# Patient Record
Sex: Male | Born: 1980 | Race: White | Hispanic: Yes | Marital: Single | Smoking: Current every day smoker
Health system: Southern US, Community
[De-identification: ages and names within clinical notes are randomized; demographics above are authoritative.]

## PROBLEM LIST (undated history)

## (undated) DIAGNOSIS — I10 Essential (primary) hypertension: Secondary | ICD-10-CM

---

## 2004-11-29 ENCOUNTER — Emergency Department (HOSPITAL_COMMUNITY): Admission: EM | Admit: 2004-11-29 | Discharge: 2004-11-29 | Payer: Self-pay | Admitting: Emergency Medicine

## 2012-02-14 ENCOUNTER — Ambulatory Visit: Payer: Self-pay

## 2012-02-14 ENCOUNTER — Ambulatory Visit: Payer: Self-pay | Admitting: Family Medicine

## 2012-02-14 VITALS — BP 118/74 | HR 66 | Temp 98.4°F | Resp 16 | Ht 65.25 in | Wt 189.4 lb

## 2012-02-14 DIAGNOSIS — M79609 Pain in unspecified limb: Secondary | ICD-10-CM

## 2012-02-14 DIAGNOSIS — M79672 Pain in left foot: Secondary | ICD-10-CM

## 2012-02-14 MED ORDER — CEPHALEXIN 500 MG PO CAPS: 1000.0000 mg | ORAL_CAPSULE | Freq: Two times a day (BID) | ORAL | Status: AC

## 2012-02-14 NOTE — Progress Notes (Signed)
  Patient Name: Alvon Nygaard Date of Birth: [birth year] Medical Record Number: [id number] Gender: male Date of Encounter: 02/14/2012  History of Present Illness:  Reinaldo Helt is a 31 y.o. very pleasant male patient who presents with the following:  On Wednesday (today is Friday) he stepped on a rusty nail while helping a friend do some work.  The nail went into his left foot through a work boot.  [facility].  However, yesterday he noted that his toes were swelling and the wound is more sore-or at least not any better.   There is no problem list on file for this patient.  No past medical history on file. No past surgical history on file. History  Substance Use Topics  . Smoking status: Current Everyday Smoker    Types: Cigarettes  . Smokeless tobacco: Not on file   Comment: smokes 1 cigarette per day  . Alcohol Use: Not on file   No family history on file. No Known Allergies  Medication list has been reviewed and updated.  Review of Systems: As per HPI- otherwise negative. He is generally healthy  Physical Examination: Filed Vitals:   02/14/12 0909  BP: 118/74  Pulse: 66  Temp: 98.4 F (36.9 C)  TempSrc: Oral  Resp: 16  Height: 5' 5.25" (1.657 m)  Weight: 189 lb 6.4 oz (85.911 kg)    Body mass index is 31.28 kg/(m^2).   GEN: WDWN, NAD, Non-toxic, Alert & Oriented x 3 HEENT: Atraumatic, Normocephalic.  Ears and Nose: No external deformity. EXTR: No clubbing/cyanosis/edema NEURO: Normal gait.  PSYCH: Normally interactive. Conversant. Not depressed or anxious appearing.  Calm demeanor.  His left foot shows a wound just proximal to the web space between his 3rd and 4th toes.  He has normal DP pulse, normal cap refill, sensation and movement of his toes. The foot is minimally swollen, but no redness or heat.  [facility] reading (PRIMARY) by  Dr. Patsy Lager.  No metallic FB or fracture   Assessment and Plan: 1. Foot pain,  left  DG Foot Complete Left, cephALEXin (KEFLEX) 500 MG capsule   Wound to foot that has caused soreness.  Treat with keflex to help prevent cellulitis as he has noted some increased soreness.  Patient (or parent if minor) instructed to return to clinic or call if not better in 2 day(s). Sooner if worse.

## 2015-08-29 ENCOUNTER — Emergency Department (HOSPITAL_COMMUNITY): Payer: Self-pay

## 2015-08-29 ENCOUNTER — Encounter (HOSPITAL_COMMUNITY): Payer: Self-pay | Admitting: *Deleted

## 2015-08-29 ENCOUNTER — Emergency Department (HOSPITAL_COMMUNITY)
Admission: EM | Admit: 2015-08-29 | Discharge: 2015-08-29 | Disposition: A | Discharge status: Home / Self Care | Payer: Self-pay | Attending: Emergency Medicine | Admitting: Emergency Medicine

## 2015-08-29 DIAGNOSIS — S39012A Strain of muscle, fascia and tendon of lower back, initial encounter: Secondary | ICD-10-CM | POA: Insufficient documentation

## 2015-08-29 DIAGNOSIS — W16212A Fall in (into) filled bathtub causing other injury, initial encounter: Secondary | ICD-10-CM | POA: Insufficient documentation

## 2015-08-29 DIAGNOSIS — Y9389 Activity, other specified: Secondary | ICD-10-CM | POA: Insufficient documentation

## 2015-08-29 DIAGNOSIS — Z720 Tobacco use: Secondary | ICD-10-CM | POA: Insufficient documentation

## 2015-08-29 DIAGNOSIS — Y9289 Other specified places as the place of occurrence of the external cause: Secondary | ICD-10-CM | POA: Insufficient documentation

## 2015-08-29 DIAGNOSIS — Y998 Other external cause status: Secondary | ICD-10-CM | POA: Insufficient documentation

## 2015-08-29 MED ORDER — CYCLOBENZAPRINE HCL 10 MG PO TABS
10.0000 mg | ORAL_TABLET | Freq: Once | ORAL | Status: AC
  Administered 2015-08-29: 10 mg via ORAL
  Filled 2015-08-29: qty 1

## 2015-08-29 MED ORDER — OXYCODONE-ACETAMINOPHEN 5-325 MG PO TABS
1.0000 | ORAL_TABLET | Freq: Once | ORAL | Status: AC
  Administered 2015-08-29: 1 via ORAL
  Filled 2015-08-29: qty 1

## 2015-08-29 MED ORDER — METHOCARBAMOL 500 MG PO TABS: 500.0000 mg | ORAL_TABLET | Freq: Two times a day (BID) | ORAL | Status: AC

## 2015-08-29 MED ORDER — DICLOFENAC SODIUM 75 MG PO TBEC: 75.0000 mg | DELAYED_RELEASE_TABLET | Freq: Two times a day (BID) | ORAL | Status: AC

## 2015-08-29 NOTE — Discharge Instructions (Signed)
Follow up with the orthopedic doctor. Do not drive while taking the muscle relaxant as it will make you sleepy.

## 2015-08-29 NOTE — ED Notes (Signed)
Pt reports he fell in tub o n 08-14-15 and back pain started after  fall. Pt reports pain is located  In lower back. OTC have not relieved pain.

## 2015-08-29 NOTE — ED Notes (Signed)
Pt is in stable condition upon d/c and ambulates from ED with spouse.

## 2015-08-29 NOTE — ED Provider Notes (Signed)
CSN: [id number]     Arrival date & time 08/29/15  1348 History  By signing my name below, I, Gwenyth Ober, attest that this documentation has been prepared under the direction and in the presence of Kerrie Buffalo, NP.  Electronically Signed: Gwenyth Ober, ED Scribe. 08/29/2015. 2:50 PM   Chief Complaint  Patient presents with  . Back Pain  . Fall   The history is provided by the patient. No language interpreter was used.    HPI Comments: Lawrence Russell is a 34 y.o. male who presents to the Emergency Department complaining of constant, moderate lower back pain that started 3 weeks ago, improved and was exacerbated last night. He has tried Ibuprofen and massage with some relief. His pain becomes worse with sitting, walking and bending. Pt reports onset of pain started after he almost fell in the shower, but caught himself with his hands before hitting the floor. Pt works in [employer]. He denies urinary symptoms.   History reviewed. No pertinent past medical history. History reviewed. No pertinent past surgical history. History reviewed. No pertinent family history. Social History  Substance Use Topics  . Smoking status: Current Every Day Smoker    Types: Cigarettes  . Smokeless tobacco: Never Used  . Alcohol Use: No    Review of Systems  Genitourinary: Negative for dysuria and difficulty urinating.  Musculoskeletal: Positive for back pain.  Skin: Negative for wound.  All other systems reviewed and are negative.   Allergies  Shellfish allergy  Home Medications   Prior to Admission medications   Medication Sig Start Date End Date Taking? Authorizing Kaylon Laroche  diclofenac (VOLTAREN) 75 MG EC tablet Take 1 tablet (75 mg total) by mouth 2 (two) times daily. 08/29/15   Hope Orlene Och, NP  methocarbamol (ROBAXIN) 500 MG tablet Take 1 tablet (500 mg total) by mouth 2 (two) times daily. 08/29/15   Hope Orlene Och, NP   BP 139/77 mmHg  Pulse 66  Temp(Src) 98 F (36.7 C) (Oral)  Resp 18   SpO2 100% Physical Exam  Constitutional: He is oriented to person, place, and time. He appears well-developed and well-nourished. No distress.  HENT:  Head: Normocephalic and atraumatic.  Eyes: EOM are normal. Pupils are equal, round, and reactive to light.  Neck: Normal range of motion. Neck supple.  Cardiovascular: Normal rate and regular rhythm.   Pulses:      Dorsalis pedis pulses are 2+ on the right side, and 2+ on the left side.  Pulmonary/Chest: Effort normal. No respiratory distress. He has no wheezes. He has no rales.  Abdominal: Soft. Bowel sounds are normal. There is no tenderness.  Musculoskeletal: Normal range of motion. He exhibits no edema.       Lumbar back: He exhibits tenderness. He exhibits normal range of motion, no deformity, no spasm and normal pulse.  Muscle spasm in the lower lumbar area Tenderness over the lumbar spine  Neurological: He is alert and oriented to person, place, and time. He has normal strength. No cranial nerve deficit or sensory deficit. Coordination and gait normal.  Reflex Scores:      Bicep reflexes are 2+ on the right side and 2+ on the left side.      Brachioradialis reflexes are 2+ on the right side and 2+ on the left side.      Patellar reflexes are 2+ on the right side and 2+ on the left side.      Achilles reflexes are 2+ on the right side and  2+ on the left side. Grips equal bilaterally  Skin: Skin is warm and dry.  Psychiatric: He has a normal mood and affect. His behavior is normal.  Nursing note and vitals reviewed.   ED Course  Procedures  Flexeril 10 mg PO DIAGNOSTIC STUDIES: Oxygen Saturation is 100% on RA, normal by my interpretation.    COORDINATION OF CARE: 2:44 PM Discussed treatment plan with pt which includes an x-ray of his lumbar spine and a muscle relaxant. Advised rest for patient. Pt agreed to plan.   Imaging Review Dg Lumbar Spine Complete  08/29/2015   CLINICAL DATA:  Larey Seat in shower 3 weeks ago.  Low back  pain.  EXAM: LUMBAR SPINE - COMPLETE 4+ VIEW  COMPARISON:  None.  FINDINGS: There is no evidence of lumbar spine fracture. Alignment is normal. Intervertebral disc spaces are maintained.  IMPRESSION: Negative.   Electronically Signed   By: Elsie Stain M.D.   On: 08/29/2015 16:26   I have personally reviewed and evaluated these images as part of my medical decision-making.   MDM  34 y.o. male with low back pain that has been off and on x 3 weeks stable for d/c without neuro deficits. Will treat for muscle spasm and he will follow up with ortho if symptoms persist.   Final diagnoses:  Lumbosacral strain, initial encounter   I personally performed the services described in this documentation, which was scribed in my presence. The recorded information has been reviewed and is accurate.    [facility] Orlene Och, NP 08/29/15 2355  Melene Plan, DO 08/30/15 9562

## 2019-09-17 ENCOUNTER — Ambulatory Visit: Payer: Self-pay | Admitting: Orthopedic Surgery

## 2019-11-04 ENCOUNTER — Other Ambulatory Visit: Payer: Self-pay

## 2019-11-04 DIAGNOSIS — Z20822 Contact with and (suspected) exposure to covid-19: Secondary | ICD-10-CM

## 2019-11-08 LAB — NOVEL CORONAVIRUS, NAA: SARS-CoV-2, NAA: DETECTED — AB

## 2019-11-16 ENCOUNTER — Telehealth: Payer: Self-pay | Admitting: *Deleted

## 2019-11-16 NOTE — Telephone Encounter (Signed)
Assisted pt with scheduling an appointment for covid testing. He voiced understanding. Appointment scheduled per pt.

## 2019-11-19 ENCOUNTER — Ambulatory Visit: Payer: HRSA Program | Attending: Internal Medicine

## 2019-11-19 DIAGNOSIS — Z20822 Contact with and (suspected) exposure to covid-19: Secondary | ICD-10-CM

## 2019-11-19 DIAGNOSIS — Z20828 Contact with and (suspected) exposure to other viral communicable diseases: Secondary | ICD-10-CM | POA: Diagnosis present

## 2019-11-20 LAB — NOVEL CORONAVIRUS, NAA: SARS-CoV-2, NAA: NOT DETECTED

## 2019-12-28 ENCOUNTER — Encounter (HOSPITAL_COMMUNITY): Payer: Self-pay | Admitting: Emergency Medicine

## 2019-12-28 ENCOUNTER — Emergency Department (HOSPITAL_COMMUNITY)
Admission: EM | Admit: 2019-12-28 | Discharge: 2019-12-28 | Disposition: A | Discharge status: Home / Self Care | Payer: Self-pay | Attending: Emergency Medicine | Admitting: Emergency Medicine

## 2019-12-28 ENCOUNTER — Emergency Department (HOSPITAL_COMMUNITY): Payer: Self-pay

## 2019-12-28 DIAGNOSIS — R05 Cough: Secondary | ICD-10-CM | POA: Insufficient documentation

## 2019-12-28 DIAGNOSIS — Z79899 Other long term (current) drug therapy: Secondary | ICD-10-CM | POA: Insufficient documentation

## 2019-12-28 DIAGNOSIS — I10 Essential (primary) hypertension: Secondary | ICD-10-CM | POA: Insufficient documentation

## 2019-12-28 DIAGNOSIS — J020 Streptococcal pharyngitis: Secondary | ICD-10-CM | POA: Insufficient documentation

## 2019-12-28 DIAGNOSIS — F17210 Nicotine dependence, cigarettes, uncomplicated: Secondary | ICD-10-CM | POA: Insufficient documentation

## 2019-12-28 HISTORY — DX: Essential (primary) hypertension: I10

## 2019-12-28 LAB — INFLUENZA PANEL BY PCR (TYPE A & B)
Influenza A By PCR: NEGATIVE
Influenza B By PCR: NEGATIVE

## 2019-12-28 LAB — GROUP A STREP BY PCR: Group A Strep by PCR: DETECTED — AB

## 2019-12-28 MED ORDER — PENICILLIN G BENZATHINE 1200000 UNIT/2ML IM SUSP
1.2000 10*6.[IU] | Freq: Once | INTRAMUSCULAR | Status: AC
  Administered 2019-12-28: 1.2 10*6.[IU] via INTRAMUSCULAR
  Filled 2019-12-28: qty 2

## 2019-12-28 MED ORDER — ACETAMINOPHEN 500 MG PO TABS
1000.0000 mg | ORAL_TABLET | Freq: Once | ORAL | Status: AC
  Administered 2019-12-28: 10:00:00 1000 mg via ORAL
  Filled 2019-12-28: qty 2

## 2019-12-28 NOTE — ED Triage Notes (Signed)
Pt here from home with c/o general body aches and fever with chills , pt had covid about 1 month ago

## 2019-12-28 NOTE — ED Provider Notes (Signed)
[facility] EMERGENCY DEPARTMENT Provider Note   CSN: [id number] Arrival date & time: 12/28/19  4332     History CC: Fever  Lawrence Russell is a 39 y.o. male with history of tobacco abuse and hypertension who presents to the emergency department with complaints of fever that began 3 days prior. Patient reports subjective fever, chills, nasal congestion, sore throat, body aches, & dry cough with 1 episode of post tussive emesis. No alleviating/aggravating factors. No intervention PTA. Tested positive for COVID 19 11/04/2019. No known sick contacts w/ similar sxs. Denies chest pain, abdominal pain, diarrhea, melena, hematochezia, unilateral leg pain/swelling, hemoptysis, recent surgery/trauma, recent long travel, hormone use, personal hx of cancer, or hx of DVT/PE.   HPI     Past Medical History:  Diagnosis Date  . Hypertension     There are no problems to display for this patient.   History reviewed. No pertinent surgical history.     No family history on file.  Social History   Tobacco Use  . Smoking status: Current Every Day Smoker    Types: Cigarettes  . Smokeless tobacco: Never Used  . Tobacco comment: smokes 1 cigarette per day  Substance Use Topics  . Alcohol use: No  . Drug use: No    Home Medications Prior to Admission medications   Medication Sig Start Date End Date Taking? Authorizing Provider  diclofenac (VOLTAREN) 75 MG EC tablet Take 1 tablet (75 mg total) by mouth 2 (two) times daily. 08/29/15   Ashley Murrain, NP  methocarbamol (ROBAXIN) 500 MG tablet Take 1 tablet (500 mg total) by mouth 2 (two) times daily. 08/29/15   Ashley Murrain, NP    Allergies    Shellfish allergy  Review of Systems   Review of Systems  Constitutional: Positive for chills and fever.  HENT: Positive for congestion and sore throat. Negative for ear pain.   Respiratory: Positive for cough. Negative for shortness of breath.   Cardiovascular: Negative for  chest pain and leg swelling.  Gastrointestinal: Positive for vomiting (x1 posttussive). Negative for abdominal pain, anal bleeding, blood in stool, constipation and diarrhea.  Genitourinary: Negative for dysuria.  Musculoskeletal: Positive for myalgias (generalized).  Neurological: Negative for syncope.    Physical Exam Updated Vital Signs BP (!) 152/108 (BP Location: Right Arm)   Pulse (!) 125   Temp (!) 101.4 F (38.6 C) (Oral)   Resp 18   SpO2 98%   Physical Exam Vitals and nursing note reviewed.  Constitutional:      General: He is not in acute distress.    Appearance: He is well-developed. He is not toxic-appearing.  HENT:     Head: Normocephalic and atraumatic.     Right Ear: Ear canal normal. Tympanic membrane is not perforated, erythematous, retracted or bulging.     Left Ear: Ear canal normal. Tympanic membrane is not perforated, erythematous, retracted or bulging.     Ears:     Comments: No mastoid erythema/swellng/tenderness.     Nose: Congestion present.     Right Sinus: No maxillary sinus tenderness or frontal sinus tenderness.     Left Sinus: No maxillary sinus tenderness or frontal sinus tenderness.     Mouth/Throat:     Pharynx: Oropharynx is clear. Uvula midline. Posterior oropharyngeal erythema present. No oropharyngeal exudate.     Tonsils: Tonsillar exudate present. 2+ on the right. 2+ on the left.     Comments: Posterior oropharynx is symmetric appearing. Patient tolerating own secretions  without difficulty. No trismus. No drooling. No hot potato voice. No swelling beneath the tongue, submandibular compartment is soft.  Eyes:     General:        Right eye: No discharge.        Left eye: No discharge.     Conjunctiva/sclera: Conjunctivae normal.  Cardiovascular:     Rate and Rhythm: Regular rhythm. Tachycardia present.  Pulmonary:     Effort: Pulmonary effort is normal. No respiratory distress.     Breath sounds: Normal breath sounds. No wheezing,  rhonchi or rales.     Comments: Ambulatory SPO2 maintained greater than 94%. Chest:     Chest wall: No tenderness.  Abdominal:     General: There is no distension.     Palpations: Abdomen is soft.     Tenderness: There is no abdominal tenderness. There is no guarding or rebound.  Musculoskeletal:     Cervical back: Neck supple. No rigidity.     Right lower leg: No edema.     Left lower leg: No edema.  Lymphadenopathy:     Cervical: Cervical adenopathy (mild) present.  Skin:    General: Skin is warm and dry.     Findings: No rash.  Neurological:     Mental Status: He is alert.  Psychiatric:        Behavior: Behavior normal.     ED Results / Procedures / Treatments   Labs (all labs ordered are listed, but only abnormal results are displayed) Labs Reviewed  GROUP A STREP BY PCR - Abnormal; Notable for the following components:      Result Value   Group A Strep by PCR DETECTED (*)    All other components within normal limits  INFLUENZA PANEL BY PCR (TYPE A & B)    EKG None  Radiology DG Chest Portable 1 View  Result Date: 12/28/2019 CLINICAL DATA:  Cough, body aches, fever EXAM: PORTABLE CHEST 1 VIEW COMPARISON:  None. FINDINGS: The heart size and mediastinal contours are within normal limits. Both lungs are clear. The visualized skeletal structures are unremarkable. IMPRESSION: No active disease. Electronically Signed   By: Duanne Guess D.O.   On: 12/28/2019 11:32    Procedures Procedures (including critical care time)  Medications Ordered in ED Medications  penicillin g benzathine (BICILLIN LA) 1200000 UNIT/2ML injection 1.2 Million Units (has no administration in time range)  acetaminophen (TYLENOL) tablet 1,000 mg (1,000 mg Oral Given 12/28/19 1026)    ED Course  I have reviewed the triage vital signs and the nursing notes.  Pertinent labs & imaging results that were available during my care of the patient were reviewed by me and considered in my medical  decision making (see chart for details).  Lawrence Russell was evaluated in Emergency Department on 12/28/2019 for the symptoms described in the history of present illness. He/she was evaluated in the context of the global COVID-19 pandemic, which necessitated consideration that the patient might be at risk for infection with the SARS-CoV-2 virus that causes COVID-19. Institutional protocols and algorithms that pertain to the evaluation of patients at risk for COVID-19 are in a state of rapid change based on information released by regulatory bodies including the CDC and federal and state organizations. These policies and algorithms were followed during the patient's care in the ED.    MDM Rules/Calculators/A&P                      Patient presents to the  emergency department with complaints of fever as well as several other associated symptoms as documented above.  Vitals notable for fever to 101.4 with likely resultant tachycardia, BP is also elevated, doubt HTN emergency.  No meningismus.  No signs of AOM, AOE, or mastoiditis.  Diagnosis of Covid within the past 90 days.  Chest x-ray without infiltrate, pneumothorax, or fluid overload.  Patient is not in respiratory distress.  Influenza testing negative.  Strep is positive.  No signs of RPA/PTA.  Tachycardia resolved following antipyretics.  Will treat with IM Bicillin in the emergency department.  Recommended further supportive care at home. I discussed results, treatment plan, need for follow-up, and return precautions with the patient. Provided opportunity for questions, patient confirmed understanding and is in agreement with plan.   Blood pressure 128/80, pulse 97, temperature (!) 101.4 F (38.6 C), temperature source Oral, resp. rate 18, SpO2 94 %.  Final Clinical Impression(s) / ED Diagnoses Final diagnoses:  Strep pharyngitis    Rx / DC Orders ED Discharge Orders    None       Desmond Lope 12/28/19 1234      Terrilee Files, MD 12/28/19 Ernestina Columbia

## 2019-12-28 NOTE — Discharge Instructions (Addendum)
You were seen in the emergency department and diagnosed with strep throat.  Your flu testing was negative.  Your chest x-ray was normal. You were given a shot of Penicillin- this is an antibiotic used to treat the infection  You should gradually feel better over the next few days. Take Tylenol and Ibuprofen for fever and pain. Follow up with your primary care provider in the next 1 week if you are not feeling better, if you do not have a primary care provider one is provided in your discharge instructions. Return to the emergency department for any new or worsening symptoms including but not limited to inability to open your mouth, inability to move your neck, worsening pain, change in your voice, inability to swallow your own saliva, drooling, or any other concerns.

## 2020-10-08 IMAGING — DX DG CHEST 1V PORT
1 series · 1 of 1 positions shown · non-contrast
Comparison: None.

CLINICAL DATA: Cough, body aches, fever

EXAM:
PORTABLE CHEST 1 VIEW

[chest ap]
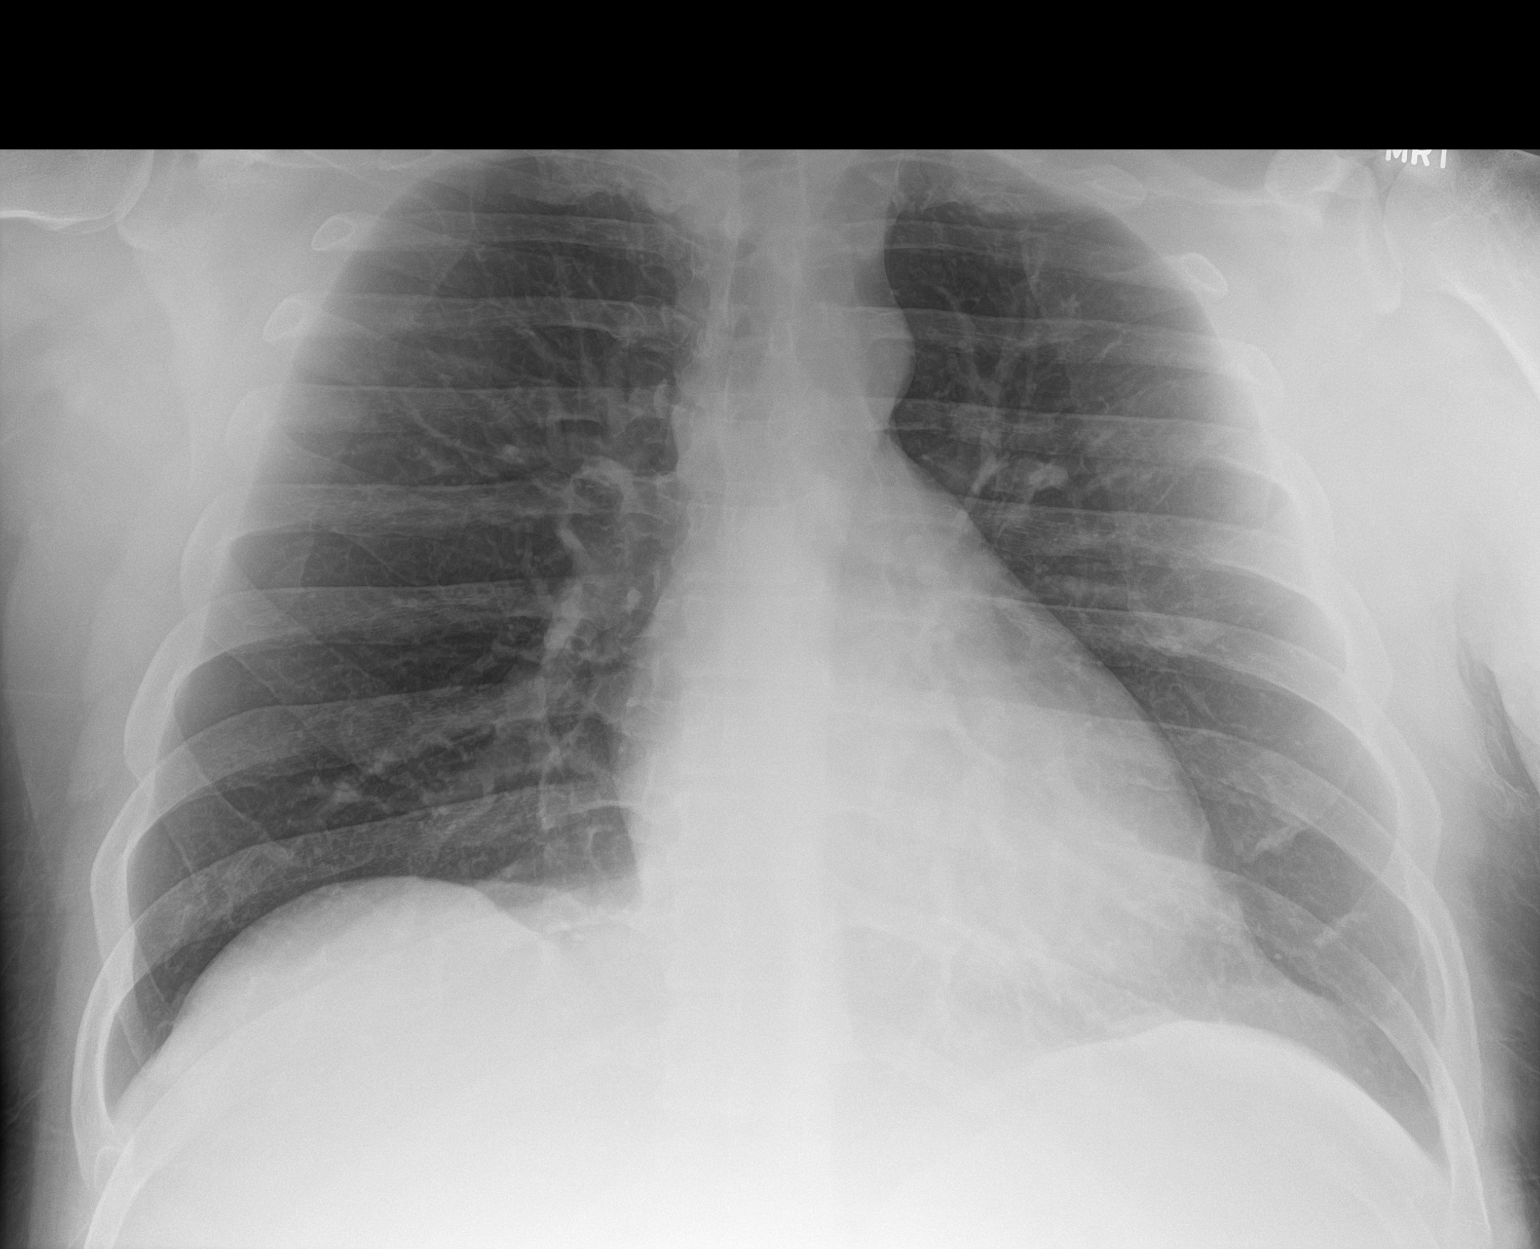

[1 of 1 positions shown; findings below may reference images not displayed]

FINDINGS: The heart size and mediastinal contours are within normal limits.
Both lungs are clear. The visualized skeletal structures are
unremarkable.
IMPRESSION: No active disease.

## 2022-09-30 ENCOUNTER — Emergency Department (HOSPITAL_COMMUNITY): Payer: Self-pay

## 2022-09-30 ENCOUNTER — Other Ambulatory Visit: Payer: Self-pay

## 2022-09-30 ENCOUNTER — Encounter (HOSPITAL_COMMUNITY): Payer: Self-pay

## 2022-09-30 ENCOUNTER — Emergency Department (HOSPITAL_COMMUNITY)
Admission: EM | Admit: 2022-09-30 | Discharge: 2022-09-30 | Disposition: A | Discharge status: Home / Self Care | Payer: Self-pay | Attending: Emergency Medicine | Admitting: Emergency Medicine

## 2022-09-30 DIAGNOSIS — Z794 Long term (current) use of insulin: Secondary | ICD-10-CM | POA: Insufficient documentation

## 2022-09-30 DIAGNOSIS — G5632 Lesion of radial nerve, left upper limb: Secondary | ICD-10-CM | POA: Insufficient documentation

## 2022-09-30 DIAGNOSIS — E1165 Type 2 diabetes mellitus with hyperglycemia: Secondary | ICD-10-CM | POA: Insufficient documentation

## 2022-09-30 DIAGNOSIS — I10 Essential (primary) hypertension: Secondary | ICD-10-CM | POA: Insufficient documentation

## 2022-09-30 DIAGNOSIS — R739 Hyperglycemia, unspecified: Secondary | ICD-10-CM

## 2022-09-30 DIAGNOSIS — Z7984 Long term (current) use of oral hypoglycemic drugs: Secondary | ICD-10-CM | POA: Insufficient documentation

## 2022-09-30 LAB — DIFFERENTIAL
Abs Immature Granulocytes: 0.01 10*3/uL (ref 0.00–0.07)
Basophils Absolute: 0 10*3/uL (ref 0.0–0.1)
Basophils Relative: 1 %
Eosinophils Absolute: 0.1 10*3/uL (ref 0.0–0.5)
Eosinophils Relative: 2 %
Immature Granulocytes: 0 %
Lymphocytes Relative: 48 %
Lymphs Abs: 2.6 10*3/uL (ref 0.7–4.0)
Monocytes Absolute: 0.4 10*3/uL (ref 0.1–1.0)
Monocytes Relative: 7 %
Neutro Abs: 2.2 10*3/uL (ref 1.7–7.7)
Neutrophils Relative %: 42 %

## 2022-09-30 LAB — CBG MONITORING, ED: Glucose-Capillary: 252 mg/dL — ABNORMAL HIGH (ref 70–99)

## 2022-09-30 LAB — CBC
HCT: 41.9 % (ref 39.0–52.0)
Hemoglobin: 14.2 g/dL (ref 13.0–17.0)
MCH: 29 pg (ref 26.0–34.0)
MCHC: 33.9 g/dL (ref 30.0–36.0)
MCV: 85.5 fL (ref 80.0–100.0)
Platelets: 243 10*3/uL (ref 150–400)
RBC: 4.9 MIL/uL (ref 4.22–5.81)
RDW: 12 % (ref 11.5–15.5)
WBC: 5.3 10*3/uL (ref 4.0–10.5)
nRBC: 0 % (ref 0.0–0.2)

## 2022-09-30 LAB — COMPREHENSIVE METABOLIC PANEL
ALT: 30 U/L (ref 0–44)
AST: 22 U/L (ref 15–41)
Albumin: 3.8 g/dL (ref 3.5–5.0)
Alkaline Phosphatase: 72 U/L (ref 38–126)
Anion gap: 10 (ref 5–15)
BUN: 11 mg/dL (ref 6–20)
CO2: 22 mmol/L (ref 22–32)
Calcium: 8.9 mg/dL (ref 8.9–10.3)
Chloride: 101 mmol/L (ref 98–111)
Creatinine, Ser: 1.02 mg/dL (ref 0.61–1.24)
GFR, Estimated: >60 mL/min (ref >60)
Glucose, Bld: 414 mg/dL — ABNORMAL HIGH (ref 70–99)
Potassium: 4 mmol/L (ref 3.5–5.1)
Sodium: 133 mmol/L — ABNORMAL LOW (ref 135–145)
Total Bilirubin: 0.7 mg/dL (ref 0.3–1.2)
Total Protein: 6.9 g/dL (ref 6.5–8.1)

## 2022-09-30 LAB — PROTIME-INR
INR: 1 (ref 0.8–1.2)
Prothrombin Time: 13 seconds (ref 11.4–15.2)

## 2022-09-30 LAB — I-STAT CHEM 8, ED
BUN: 12 mg/dL (ref 6–20)
Calcium, Ion: 1.13 mmol/L — ABNORMAL LOW (ref 1.15–1.40)
Chloride: 99 mmol/L (ref 98–111)
Creatinine, Ser: 0.8 mg/dL (ref 0.61–1.24)
Glucose, Bld: 418 mg/dL — ABNORMAL HIGH (ref 70–99)
HCT: 44 % (ref 39.0–52.0)
Hemoglobin: 15 g/dL (ref 13.0–17.0)
Potassium: 4 mmol/L (ref 3.5–5.1)
Sodium: 134 mmol/L — ABNORMAL LOW (ref 135–145)
TCO2: 24 mmol/L (ref 22–32)

## 2022-09-30 LAB — TROPONIN I (HIGH SENSITIVITY): Troponin I (High Sensitivity): 5 ng/L (ref <18)

## 2022-09-30 LAB — APTT: aPTT: 26 seconds (ref 24–36)

## 2022-09-30 MED ORDER — INSULIN ASPART 100 UNIT/ML IJ SOLN
5.0000 [IU] | Freq: Once | INTRAMUSCULAR | Status: AC
  Administered 2022-09-30: 5 [IU] via SUBCUTANEOUS

## 2022-09-30 NOTE — ED Provider Triage Note (Signed)
Emergency Medicine Provider Triage Evaluation Note  Lawrence Russell , a 41 y.o. male  was evaluated in triage.  Pt complains of Left wrist drop . Woke up with it on Sunday. No other neuro deficiits  Review of Systems  Positive: Wrist drop Negative: Leg weakness  Physical Exam  BP (!) 146/104 (BP Location: Right Arm)   Pulse 71   Temp 98.6 F (37 C)   Resp 16   SpO2 97%  Gen:   Awake, no distress   Resp:  Normal effort  MSK:   Moves extremities without difficulty  Other:  Isolated L wrist extensor weakness  Medical Decision Making  Medically screening exam initiated at 9:57 AM.  Appropriate orders placed.  Lawrence Russell was informed that the remainder of the evaluation will be completed by another provider, this initial triage assessment does not replace that evaluation, and the importance of remaining in the ED until their evaluation is complete.  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  Work-up initiated   Margarita Mail, PA-C 09/30/22 [id number]

## 2022-09-30 NOTE — ED Triage Notes (Addendum)
Pt. Stated, when I woke up Saturday morning  I could not flex my left hand upward.  Hand grips equal, no arm drift. I do remembering falling on Thursday or Friday on my hands and knees.

## 2022-09-30 NOTE — Discharge Instructions (Signed)
Your wrist drop is likely due to a temporary nerve injury which will improve over time.  Wear wrist brace for support.  Your blood sugar is high today. Follow up with your doctor for recheck and for further management.  You may follow up with neurologist if you notice no improvement after 1 week.

## 2022-09-30 NOTE — ED Provider Notes (Signed)
[facility]ERGENC[facility] DEPARTMENT Provider Note   CSN: [id number] Arrival date & time: 09/30/22  [id number]     History  Chief Complaint  Patient presents with   Wrist Problem   hand numbness    Lawrence Russell is a 41 y.o. male.  The history is provided by the patient and medical records. No language interpreter was used.     Lawrence Russell is a 6y male with a history of T2DM and hypertension here for 2 days of left wrist weakness and numbness. He is right handed. He does admit to a fall on Thursday though there was no pain, bruising, or swelling afterwards. The fall was from standing to the ground with both hands. He does state that he drank around 4 beers and 6 shots of liquor on Friday night. His wrist drop began on Saturday morning, he has been able to extend his left wrist, has weakness of grip and numbness. He denies any other trauma, illnesses or injury.   Home Medications Prior to Admission medications   Medication Sig Start Date End Date Taking? Authorizing Provider  diclofenac (VOLTAREN) 75 MG EC tablet Take 1 tablet (75 mg total) by mouth 2 (two) times daily. 08/29/15   Ashley Murrain, NP  methocarbamol (ROBAXIN) 500 MG tablet Take 1 tablet (500 mg total) by mouth 2 (two) times daily. 08/29/15   Ashley Murrain, NP      Allergies    Shellfish allergy    Review of Systems   Review of Systems  All other systems reviewed and are negative.   Physical Exam Updated Vital Signs BP (!) 133/97 (BP Location: Right Arm)   Pulse 76   Temp 97.9 F (36.6 C) (Oral)   Resp 18   Ht 5' 5.25" (1.657 m)   Wt 86 kg   SpO2 98%   BMI 31.31 kg/m  Physical Exam Vitals and nursing note reviewed.  Constitutional:      General: He is not in acute distress.    Appearance: He is well-developed.  HENT:     Head: Atraumatic.  Eyes:     Conjunctiva/sclera: Conjunctivae normal.  Neck:     Comments: Neck with full range of motion, negative Spurling  sign Musculoskeletal:     Cervical back: Normal range of motion and neck supple. No rigidity.  Skin:    Findings: No rash.  Neurological:     Mental Status: He is alert.     Comments: Left arm: Wrist is in a slightly flexed position.  Unable to fully extend wrist but able to flex without difficulty.  Sensation minimally decreased to the dorsum of the hand most significant to the thumb index and middle finger.  Radial pulse 2+.  Left elbow or left shoulder with full range of motion and nontender.  Left neck nontender.  No cervical midline spine tenderness.  Brisk cap refill to all fingers.     ED Results / Procedures / Treatments   Labs (all labs ordered are listed, but only abnormal results are displayed) Labs Reviewed  COMPREHENSIVE METABOLIC PANEL - Abnormal; Notable for the following components:      Result Value   Sodium 133 (*)    Glucose, Bld 414 (*)    All other components within normal limits  I-STAT CHEM 8, ED - Abnormal; Notable for the following components:   Sodium 134 (*)    Glucose, Bld 418 (*)    Calcium, Ion 1.13 (*)    All other  components within normal limits  PROTIME-INR  APTT  CBC  DIFFERENTIAL  ETHANOL  RAPID URINE DRUG SCREEN, HOSP PERFORMED  URINALYSIS, ROUTINE W REFLEX MICROSCOPIC  TROPONIN I (HIGH SENSITIVITY)  TROPONIN I (HIGH SENSITIVITY)    EKG None  Date: 09/30/2022  Rate: 75  Rhythm: normal sinus rhythm  QRS Axis: normal  Intervals: normal  ST/T Wave abnormalities: normal  Conduction Disutrbances: none  Narrative Interpretation:   Old EKG Reviewed: No significant changes noted    Radiology CT Cervical Spine Wo Contrast  Result Date: 09/30/2022 CLINICAL DATA:  41 year old male with left upper extremity weakness beginning two mornings ago. Recent fall. EXAM: CT CERVICAL SPINE WITHOUT CONTRAST TECHNIQUE: Multidetector CT imaging of the cervical spine was performed without intravenous contrast. Multiplanar CT image reconstructions were  also generated. RADIATION DOSE REDUCTION: This exam was performed according to the departmental dose-optimization program which includes automated exposure control, adjustment of the mA and/or kV according to patient size and/or use of iterative reconstruction technique. COMPARISON:  Head CT today reported separately. FINDINGS: Alignment: Straightening and mild reversal of cervical lordosis. Cervicothoracic junction alignment is within normal limits. Bilateral posterior element alignment is within normal limits. Skull base and vertebrae: Bone mineralization is within normal limits. Visualized skull base is intact. No atlanto-occipital dissociation. C1 and C2 appear intact and aligned. No acute osseous abnormality identified. Soft tissues and spinal canal: No prevertebral fluid or swelling. No visible canal hematoma. Negative noncontrast visible neck soft tissues. Disc levels: Intermittent cervical spine disc and endplate degeneration. But no convincing cervical spinal stenosis by CT. Associated neural foraminal stenosis appears most pronounced at the right C4 and C5 nerve levels (severe). Upper chest: Negative. IMPRESSION: 1. No acute traumatic injury identified in the cervical spine. 2. Cervical spine degeneration but no convincing spinal stenosis by CT. And associated neural foraminal stenosis appears most pronounced at the right C4 and C5 nerve levels. Cervical spine MRI may be valuable to further characterize. Electronically Signed   By: Genevie Ann M.D.   On: 09/30/2022 11:00   CT HEAD WO CONTRAST  Result Date: 09/30/2022 CLINICAL DATA:  41 year old male with left upper extremity weakness beginning two mornings ago. Recent fall. EXAM: CT HEAD WITHOUT CONTRAST TECHNIQUE: Contiguous axial images were obtained from the base of the skull through the vertex without intravenous contrast. RADIATION DOSE REDUCTION: This exam was performed according to the departmental dose-optimization program which includes automated  exposure control, adjustment of the mA and/or kV according to patient size and/or use of iterative reconstruction technique. COMPARISON:  None Available. FINDINGS: Brain: Cerebral volume is within normal limits. No midline shift, ventriculomegaly, mass effect, evidence of mass lesion, intracranial hemorrhage or evidence of cortically based acute infarction. Gray-white matter differentiation is within normal limits throughout the brain. No encephalomalacia identified. Vascular: No suspicious intracranial vascular hyperdensity. Skull: Intact, negative. Sinuses/Orbits: Minor paranasal sinus mucosal thickening and/or fluid. Tympanic cavities and mastoids are clear. Other: Visualized orbits and scalp soft tissues are within normal limits. IMPRESSION: 1. Normal noncontrast CT appearance of the brain. 2. Minor paranasal sinus inflammation. Electronically Signed   By: Genevie Ann M.D.   On: 09/30/2022 10:56    Procedures Procedures    Medications Ordered in ED Medications - No data to display  ED Course/ Medical Decision Making/ A&P Clinical Course as of 09/30/22 1721  Mon Sep 30, 2022  1711 Stable 41 YOM with right hand drop. No neck pain.  CT with spinal stenosis.  [CC]  [place]  Clinical Course User Index [CC] Tretha Sciara, MD                           Medical Decision Making  BP (!) 133/97 (BP Location: Right Arm)   Pulse 76   Temp 97.9 F (36.6 C) (Oral)   Resp 18   Ht 5' 5.25" (1.657 m)   Wt 86 kg   SpO2 98%   BMI 31.31 kg/m   4:33 PM This is a 41 year old male significant history of DM and hypertension, presenting today with complaints of left wrist weakness/numbness.  Patient report for the past 3 days he noticed both weakness and tingling sensation about his left wrist down to his hand.  He is unable to fully extend his wrist.  He does not endorse any significant associated pain.  He denies having any neck pain no elbow pain no headache.  He is right-hand dominant.  He did  recall going out and drinking moderate amount of alcohol 4 days prior and the next day he woke up with his eye wrist problem.  He did recall falling 2 days prior to that but it was a small fall at ground-level height where he did use both of his hands to break his fall but denies any pain at that time and no loss of consciousness.  He does have history of diabetes and takes metformin and glipizide.  He noted his hemoglobin A1c was 16 that was checked a month ago.  On exam of left arm: Wrist is in a slightly flexed position.  Unable to fully extend wrist but able to flex without difficulty.  Sensation minimally decreased to the dorsum of the hand most significant to the thumb index and middle finger.  Radial pulse 2+.  Left elbow or left shoulder with full range of motion and nontender.  Left neck nontender.  No cervical midline spine tenderness.  Brisk cap refill to all fingers.  -Patient's presentation is suggestive of a radial nerve palsy likely suggestive of Saturday night palsy.  It is less likely to be stroke or MS.  Does not have any significant neck or elbow pain.  -Labs ordered, independently viewed and interpreted by me.  Labs remarkable for mild hypocalcemia of 1.13 but not likely causing patient's wrist drop.  CBG is mildly elevated at 414 without signs of DKA. -EKG interpreted by me showing NSR -imaging independently viewed and interpreted by me and I agree with radiologist's interpretation.  Result remarkable for normal head CT however Cspine CT shows neural foraminal stenosis most pronounced at the right C4 and C5 nerve levels.   -DDx: radial nerve palsy, stroke, MS, neuropraxia -treatment includes wrist splint -PCP office notes or outside notes reviewed -Discussion with attending Dr. Oswald Hillock who also evaluated patient and agrees with plan -Escalation to admission/observation considered: patients feels much better with wrist splinting, is comfortable with discharge, and will follow up  with PCP for better CBG control and with neurology if neuropraxia persists -Prescription medication considered, patient comfortable with wrist brace -Social Determinant of Health considered which includes tobacco use, recommend cessation  Patient given insulin for elevated CBG.  Will need to follow-up closely with PCP for better management of his hyperglycemia.  At this time felt his symptoms likely peripheral nerve palsy and less likely to be central cause.          Final Clinical Impression(s) / ED Diagnoses Final diagnoses:  Acute radial nerve palsy, left  Hyperglycemia  Rx / DC Orders ED Discharge Orders     None         Domenic Moras, PA-C 09/30/22 1755    Tretha Sciara, MD 10/03/22 1558

## 2024-10-08 ENCOUNTER — Encounter (HOSPITAL_BASED_OUTPATIENT_CLINIC_OR_DEPARTMENT_OTHER): Payer: Self-pay

## 2024-10-08 ENCOUNTER — Emergency Department (HOSPITAL_BASED_OUTPATIENT_CLINIC_OR_DEPARTMENT_OTHER)
Admission: EM | Admit: 2024-10-08 | Discharge: 2024-10-08 | Discharge status: Against Medical Advice | Payer: Self-pay | Attending: Emergency Medicine | Admitting: Emergency Medicine

## 2024-10-08 ENCOUNTER — Other Ambulatory Visit: Payer: Self-pay

## 2024-10-08 DIAGNOSIS — Z5321 Procedure and treatment not carried out due to patient leaving prior to being seen by health care provider: Secondary | ICD-10-CM | POA: Insufficient documentation

## 2024-10-08 DIAGNOSIS — S61215A Laceration without foreign body of left ring finger without damage to nail, initial encounter: Secondary | ICD-10-CM | POA: Insufficient documentation

## 2024-10-08 DIAGNOSIS — W260XXA Contact with knife, initial encounter: Secondary | ICD-10-CM | POA: Insufficient documentation

## 2024-10-08 NOTE — ED Triage Notes (Addendum)
 Patient cut his 4th digit on his left hand with a knife. He reports he felt it go down to the bone. Bleeding controlled with gauze. Says his last tetanus shot was 6 months ago.

## 2024-10-08 NOTE — ED Notes (Signed)
 Patient states to Charge Nurse that he had to return to work. RN told patient that a room was going to be available soon d/t pending discharges.
